# Patient Record
Sex: Male | Born: 1965 | Race: White | Hispanic: No | Marital: Married | Smoking: Current every day smoker
Health system: Southern US, Community
[De-identification: ages and names within clinical notes are randomized; demographics above are authoritative.]

## PROBLEM LIST (undated history)

## (undated) DIAGNOSIS — B1920 Unspecified viral hepatitis C without hepatic coma: Secondary | ICD-10-CM

## (undated) DIAGNOSIS — F17200 Nicotine dependence, unspecified, uncomplicated: Secondary | ICD-10-CM

## (undated) DIAGNOSIS — K859 Acute pancreatitis without necrosis or infection, unspecified: Secondary | ICD-10-CM

## (undated) HISTORY — DX: Unspecified viral hepatitis C without hepatic coma: B19.20

## (undated) HISTORY — DX: Acute pancreatitis without necrosis or infection, unspecified: K85.90

## (undated) HISTORY — DX: Nicotine dependence, unspecified, uncomplicated: F17.200

---

## 2004-12-16 ENCOUNTER — Other Ambulatory Visit: Payer: Self-pay

## 2004-12-16 ENCOUNTER — Emergency Department: Payer: Self-pay | Admitting: Emergency Medicine

## 2005-08-08 ENCOUNTER — Emergency Department: Payer: Self-pay | Admitting: Emergency Medicine

## 2006-10-04 IMAGING — CR DG CHEST 2V
1 series · 2 of 2 positions shown · non-contrast
Comparison: none

REASON FOR EXAM: Rule out infiltrate, chest pain
COMMENTS:

[Series 1373: postero_anterior · 0.11mm/px · 2 of 2 slices shown]
[im 1/2]
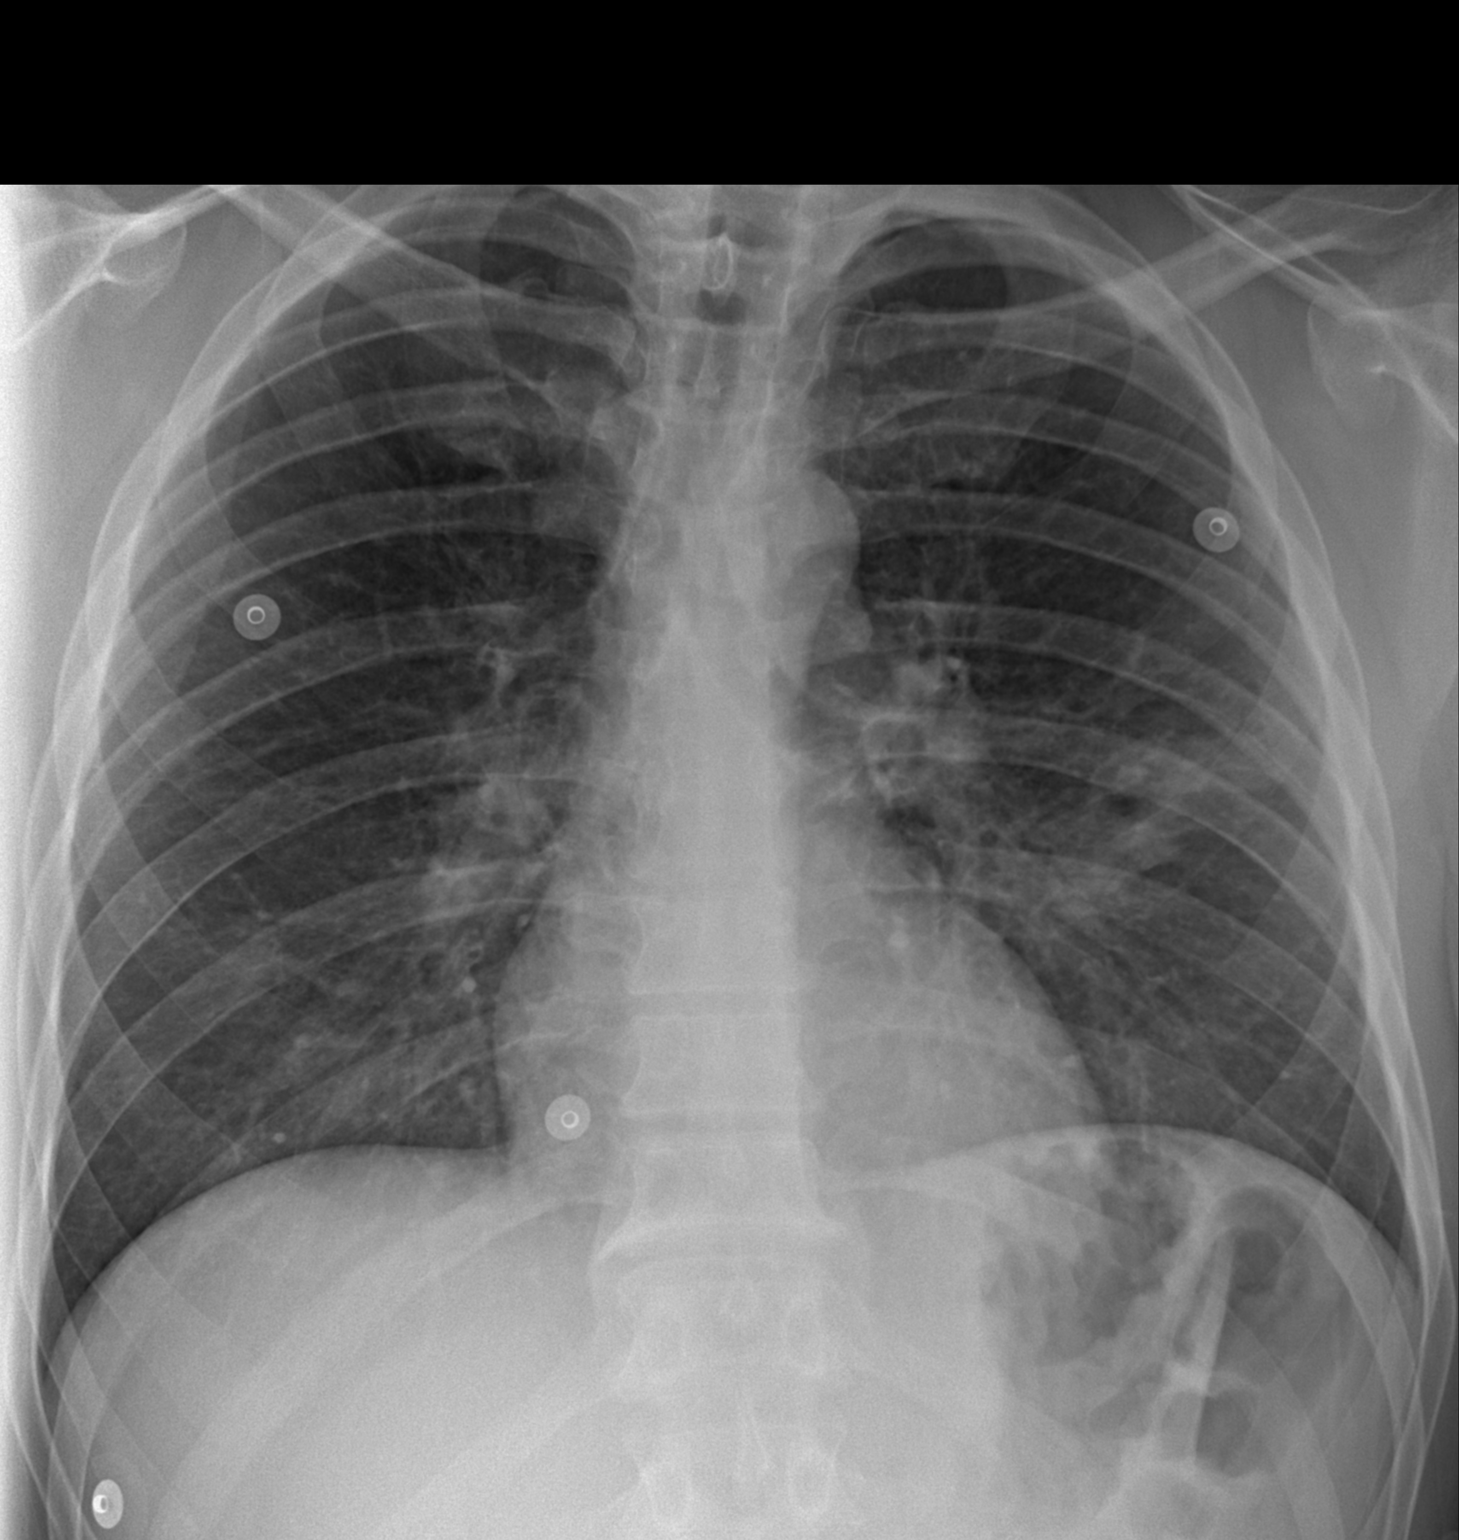
[im 2/2]
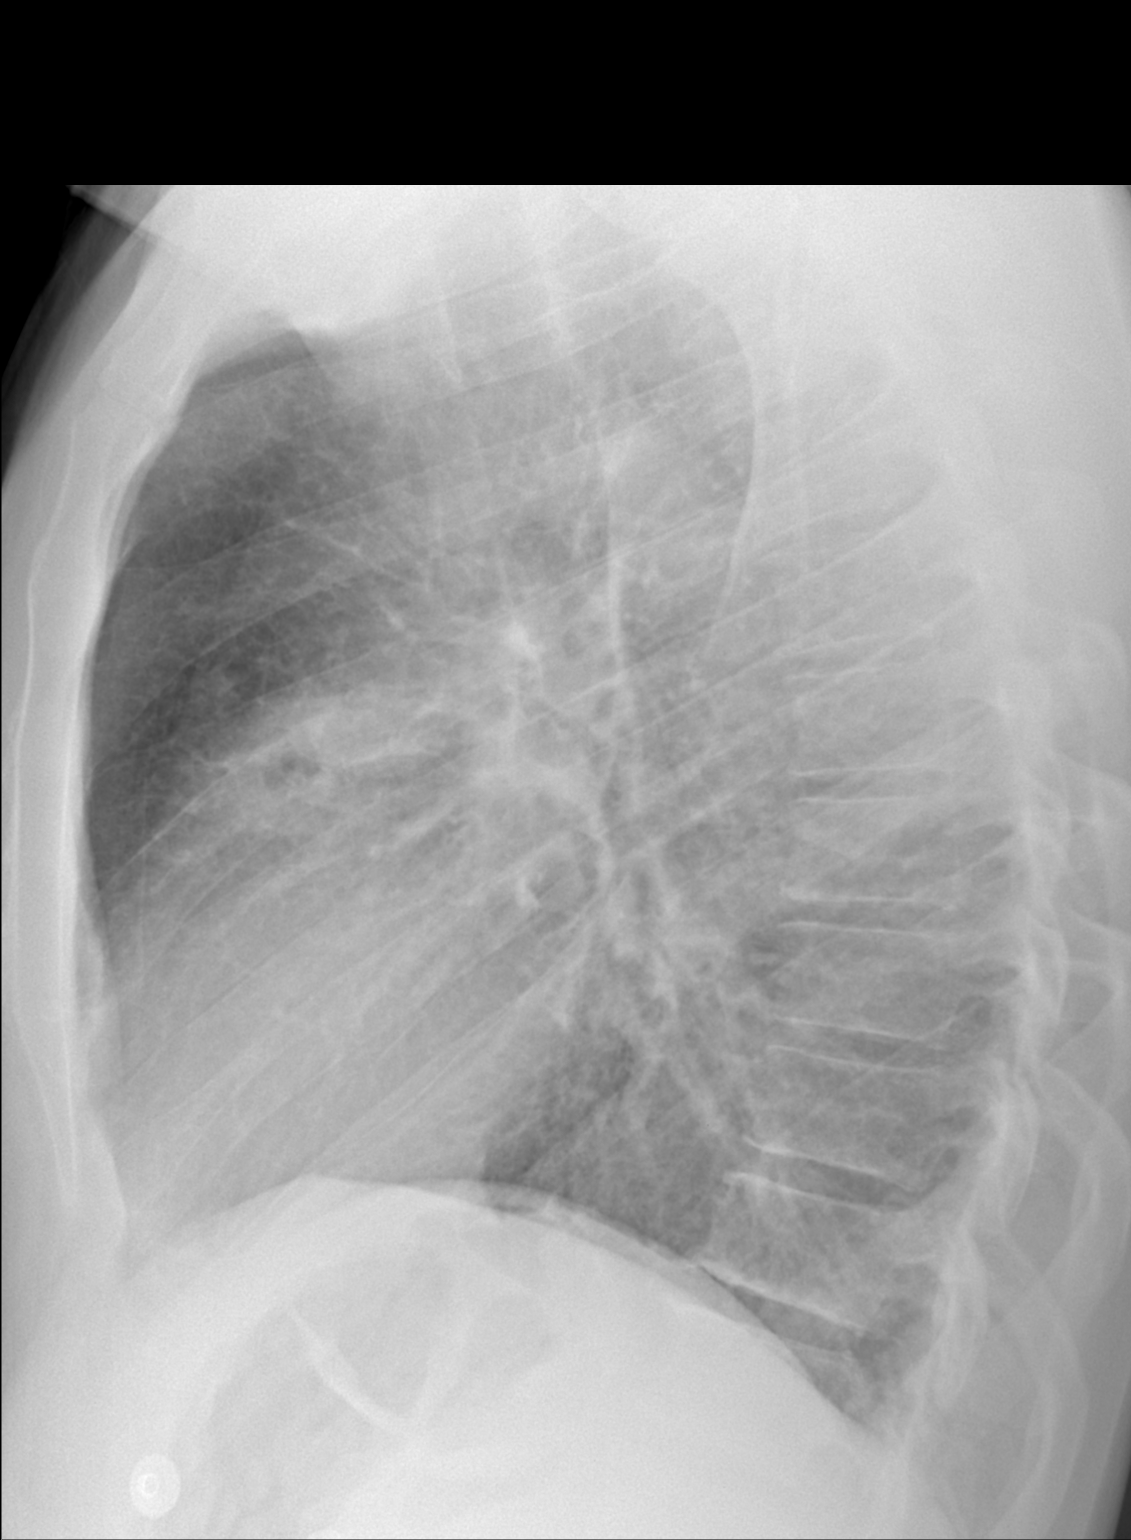

[2 of 2 positions shown; findings below may reference images not displayed]

PROCEDURE:     DXR - DXR CHEST PA (OR AP) AND LATERAL  - December 16, 2004  [DATE]

RESULT:     The lungs are hyperexpanded compatible with a history of COPD or
asthma. There is a patchy infiltrate in the LEFT mid lung field compatible
with pneumonia or atelectasis. The RIGHT lung field is clear. The heart size
is normal. The mediastinal and osseous structures are normal in appearance.
IMPRESSION: 1.     There is patchy infiltrate in the LEFT mid lung field consistent with
pneumonia and atelectasis.
2.     The chest is hyperexpanded compatible with a history of COPD or
asthma.

## 2012-04-20 ENCOUNTER — Emergency Department: Payer: Self-pay | Admitting: Emergency Medicine

## 2012-04-20 LAB — BASIC METABOLIC PANEL
Calcium, Total: 9.2 mg/dL (ref 8.5–10.1)
Chloride: 103 mmol/L (ref 98–107)
Co2: 29 mmol/L (ref 21–32)
Creatinine: 0.8 mg/dL (ref 0.60–1.30)
EGFR (African American): >60
EGFR (Non-African Amer.): >60
Osmolality: 271 (ref 275–301)
Potassium: 4.1 mmol/L (ref 3.5–5.1)

## 2013-10-09 ENCOUNTER — Ambulatory Visit: Payer: Self-pay | Admitting: Physician Assistant

## 2013-10-09 LAB — URINALYSIS, COMPLETE
BLOOD: NEGATIVE
Bacteria: NEGATIVE
GLUCOSE, UR: NEGATIVE mg/dL (ref 0–75)
Leukocyte Esterase: NEGATIVE
NITRITE: NEGATIVE
Ph: 6 (ref 4.5–8.0)
SPECIFIC GRAVITY: 1.02 (ref 1.003–1.030)
SQUAMOUS EPITHELIAL: NONE SEEN

## 2013-10-09 LAB — CBC WITH DIFFERENTIAL/PLATELET
BASOS PCT: 0.9 %
Basophil #: 0.1 10*3/uL (ref 0.0–0.1)
EOS PCT: 0.5 %
Eosinophil #: 0.1 10*3/uL (ref 0.0–0.7)
HCT: 48.2 % (ref 40.0–52.0)
HGB: 16.4 g/dL (ref 13.0–18.0)
LYMPHS PCT: 15.3 %
Lymphocyte #: 2.2 10*3/uL (ref 1.0–3.6)
MCH: 33.8 pg (ref 26.0–34.0)
MCHC: 34.1 g/dL (ref 32.0–36.0)
MCV: 99 fL (ref 80–100)
MONO ABS: 1.4 x10 3/mm — AB (ref 0.2–1.0)
Monocyte %: 9.6 %
NEUTROS ABS: 10.4 10*3/uL — AB (ref 1.4–6.5)
Neutrophil %: 73.7 %
Platelet: 248 10*3/uL (ref 150–440)
RBC: 4.85 10*6/uL (ref 4.40–5.90)
RDW: 13.1 % (ref 11.5–14.5)
WBC: 14.1 10*3/uL — AB (ref 3.8–10.6)

## 2013-10-09 LAB — COMPREHENSIVE METABOLIC PANEL
ALBUMIN: 3.7 g/dL (ref 3.4–5.0)
ALT: 58 U/L (ref 12–78)
AST: 30 U/L (ref 15–37)
Alkaline Phosphatase: 97 U/L
Anion Gap: 12 (ref 7–16)
BUN: 8 mg/dL (ref 7–18)
Bilirubin,Total: 0.6 mg/dL (ref 0.2–1.0)
CHLORIDE: 99 mmol/L (ref 98–107)
CREATININE: 0.8 mg/dL (ref 0.60–1.30)
Calcium, Total: 9.3 mg/dL (ref 8.5–10.1)
Co2: 26 mmol/L (ref 21–32)
EGFR (African American): >60
EGFR (Non-African Amer.): >60
Glucose: 86 mg/dL (ref 65–99)
OSMOLALITY: 271 (ref 275–301)
POTASSIUM: 3.8 mmol/L (ref 3.5–5.1)
Sodium: 137 mmol/L (ref 136–145)
Total Protein: 8.3 g/dL — ABNORMAL HIGH (ref 6.4–8.2)

## 2013-10-09 LAB — LIPASE, BLOOD: Lipase: 196 U/L (ref 73–393)

## 2013-10-09 LAB — AMYLASE: Amylase: 80 U/L (ref 25–115)

## 2015-05-19 IMAGING — CT CT ABD-PELV W/ CM
2 of 5 series · 16 of 46 positions shown, 18 images · IV contrast (isovue)
Comparison: 08/08/2005.

CLINICAL DATA: Epigastric pain with leukocytosis.

EXAM:
CT ABDOMEN AND PELVIS WITH CONTRAST
TECHNIQUE: Multidetector CT imaging of the abdomen and pelvis was performed
using the standard protocol following bolus administration of
intravenous contrast.
CONTRAST:  100 cc Isovue 370.

[Series 2: routine abd pel with · axial · 0.71mm/px · z∈[-527,-137]mm · 13 of 88 slices shown, 15 images]
[im 5/88  soft-tissue]
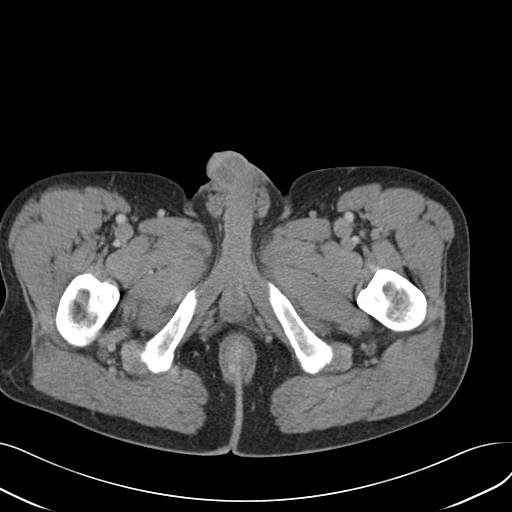
[im 5/88  bone]
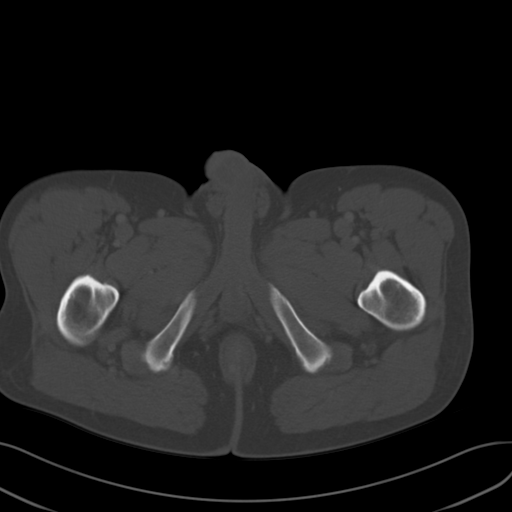
[im 14/88  soft-tissue]
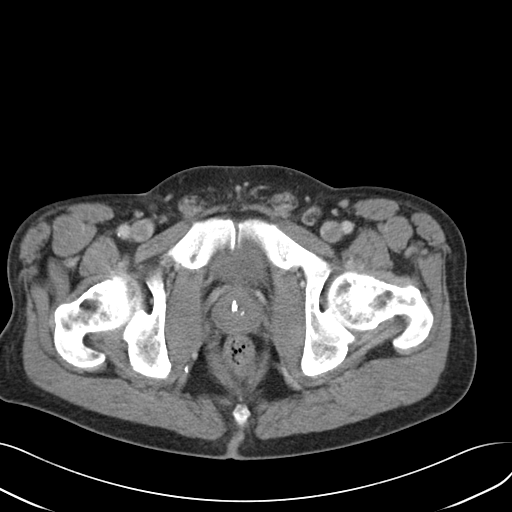
[im 19/88  soft-tissue]
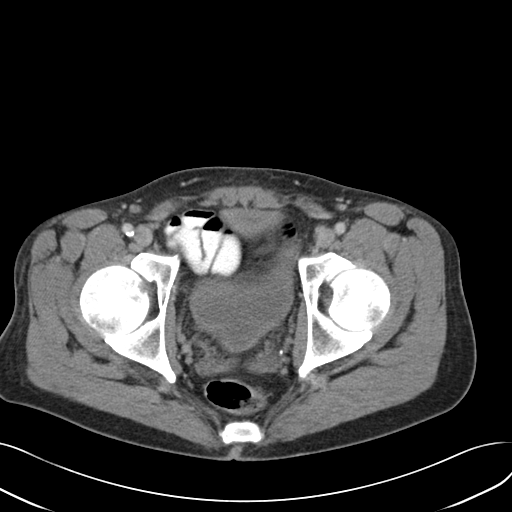
[im 23/88  soft-tissue]
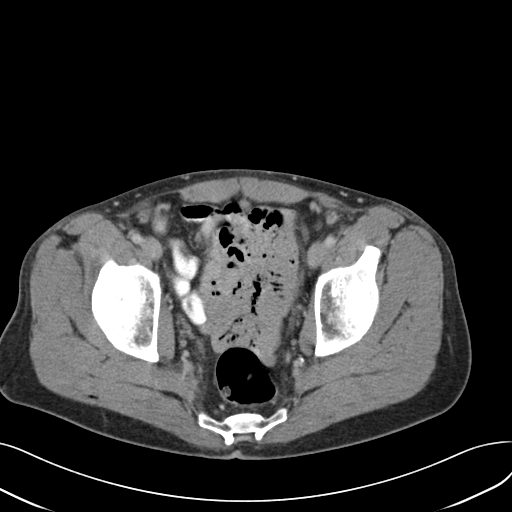
[im 33/88  soft-tissue]
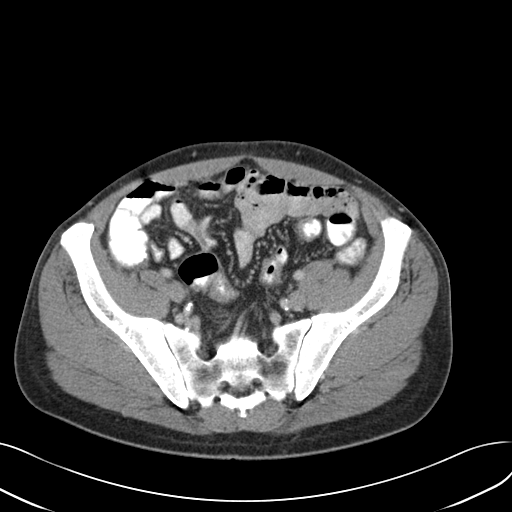
[im 37/88  soft-tissue]
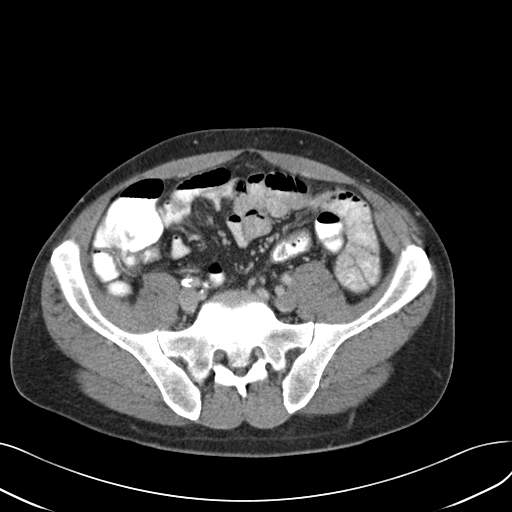
[im 46/88  soft-tissue]
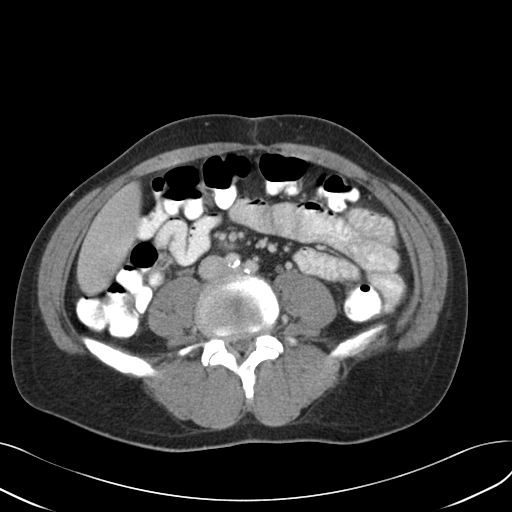
[im 51/88  soft-tissue]
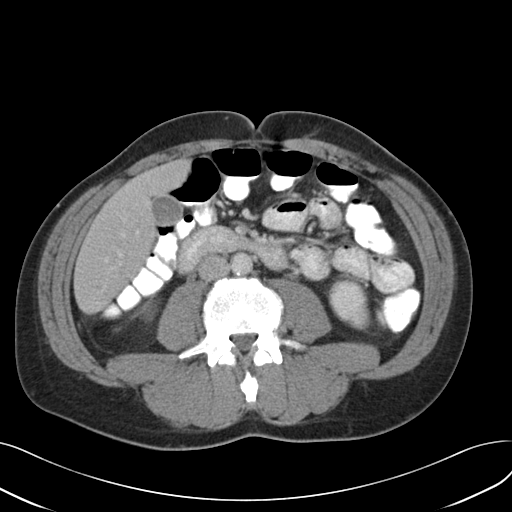
[im 55/88  soft-tissue]
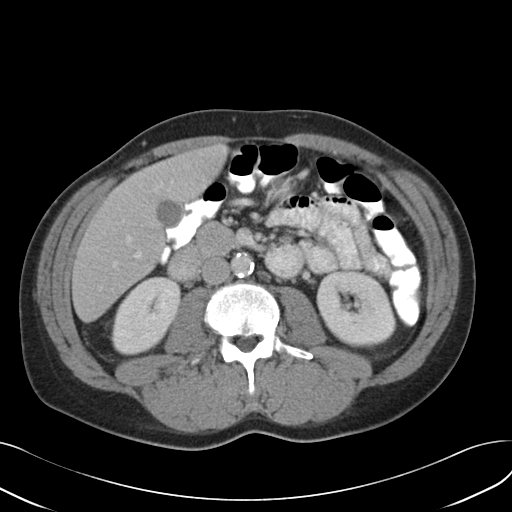
[im 55/88  bone]
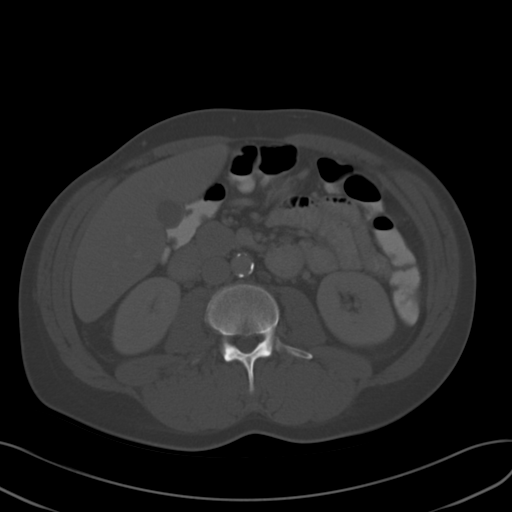
[im 65/88  soft-tissue]
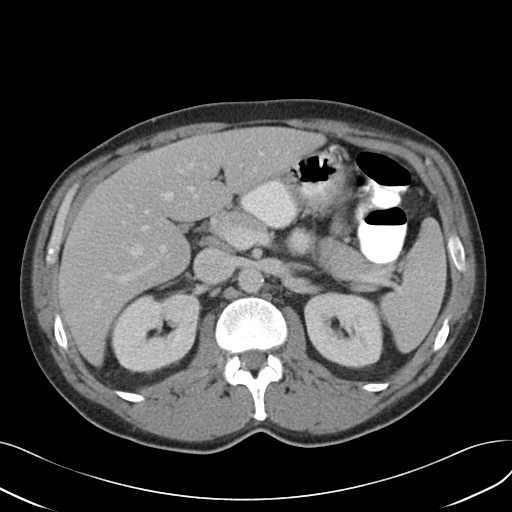
[im 69/88  soft-tissue]
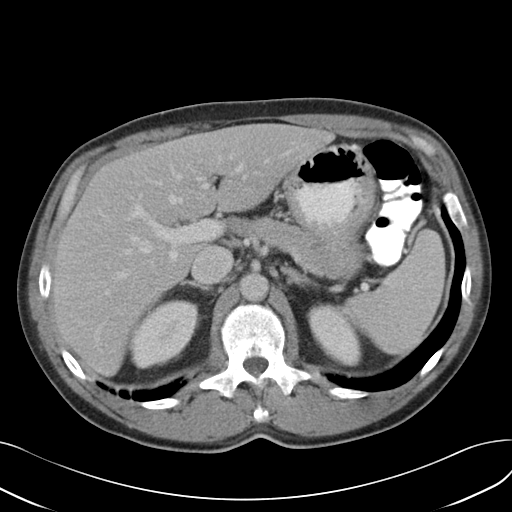
[im 74/88  soft-tissue]
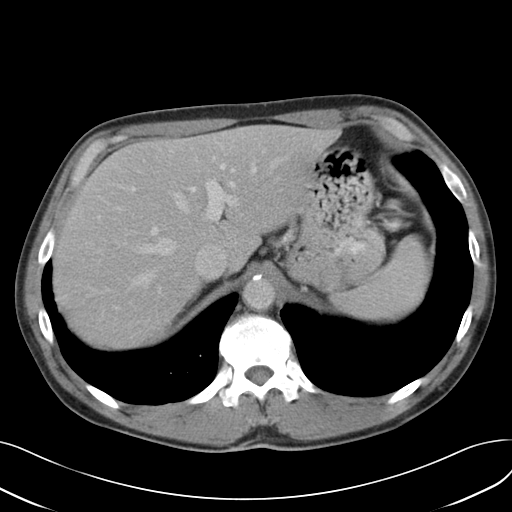
[im 83/88  soft-tissue]
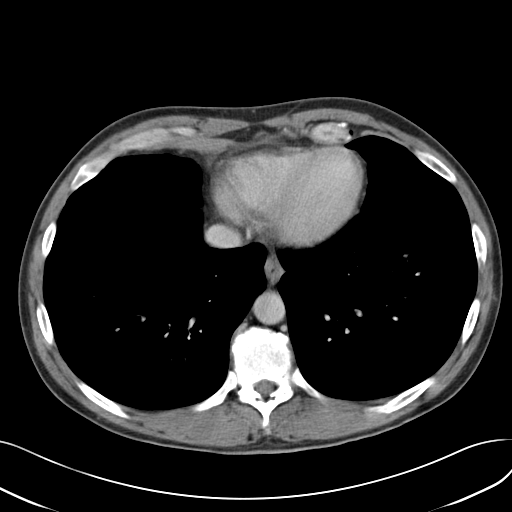

[Series 5: cor routine abd pel with · coronal · 0.65mm/px · 3 of 120 slices shown]
[im 40/120  soft-tissue]
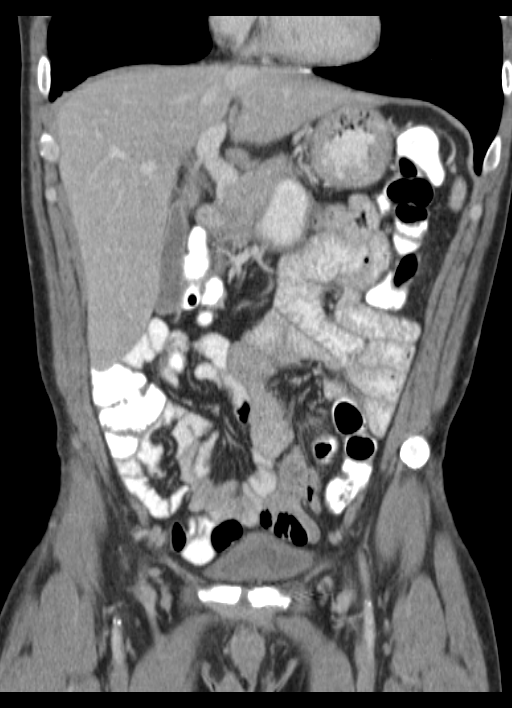
[im 53/120  soft-tissue]
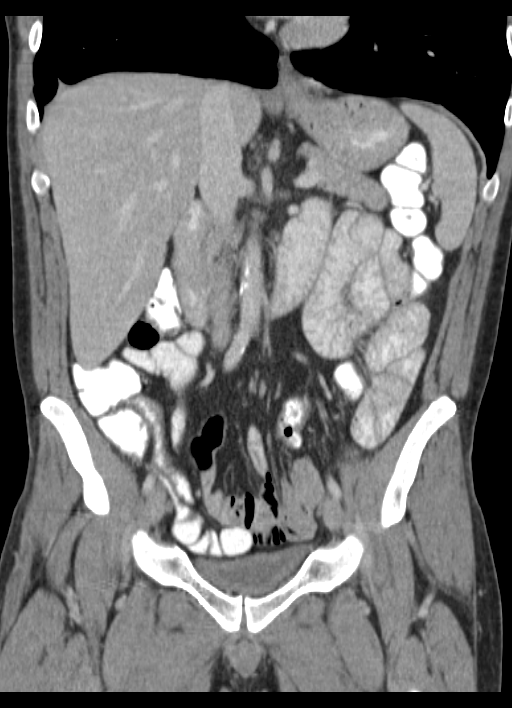
[im 67/120  soft-tissue]
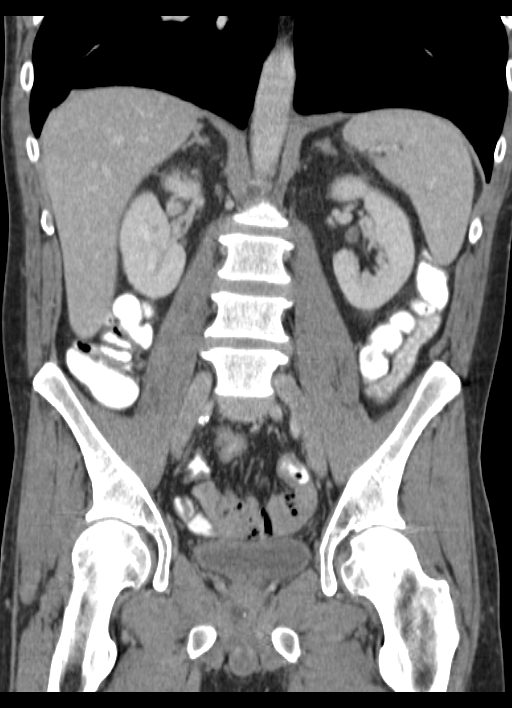

[16 of 46 positions shown; findings below may reference images not displayed]

FINDINGS: Lung Bases: Chronic bronchial wall thickening is evident.

Liver:  Normal.

Spleen: Normal.

Stomach: Normal.

Pancreas: Normal without evidence for mass lesion or main duct
dilatation. No evidence for peripancreatic edema or inflammation.

Gallbladder/Biliary: Gallbladder is unremarkable. No intra or
extrahepatic biliary duct dilatation.

Kidneys/Adrenals: No adrenal nodule or mass. Kidneys are normal
bilaterally.

Bowel Loops: Duodenum is normal. Small bowel loops are normal
without dilatation or wall thickening. Terminal ileum is normal. The
appendix is normal. Oral contrast material has migrated through to
the rectum. Colon has normal imaging features without wall
thickening or dilatation. No evidence for colonic diverticulitis.

Nodes: No lymphadenopathy in the abdomen. There is no pelvic
lymphadenopathy.

Vasculature: Atherosclerotic calcification is noted in the wall of
the abdominal aorta without aneurysm.

Pelvic Genitourinary: Bladder is normal in appearance. Dystrophic
calcification in the central prostate gland is nonspecific.

Bones/Musculoskeletal: Bone windows reveal no worrisome lytic or
sclerotic osseous lesions.

Body Wall: No abdominal wall hernia.

Other: No intraperitoneal free fluid.
IMPRESSION: No acute findings in the abdomen or pelvis. Specifically, no
findings to explain the patient's history of epigastric pain with
leukocytosis.

## 2017-10-28 ENCOUNTER — Encounter: Payer: Self-pay | Admitting: Family Medicine

## 2017-10-28 ENCOUNTER — Ambulatory Visit: Payer: Self-pay | Admitting: Family Medicine

## 2017-10-28 VITALS — BP 170/90 | HR 89 | Temp 98.2°F | Resp 16 | Ht 67.25 in | Wt 175.3 lb

## 2017-10-28 DIAGNOSIS — B182 Chronic viral hepatitis C: Secondary | ICD-10-CM | POA: Insufficient documentation

## 2017-10-28 DIAGNOSIS — I10 Essential (primary) hypertension: Secondary | ICD-10-CM

## 2017-10-28 DIAGNOSIS — Z720 Tobacco use: Secondary | ICD-10-CM

## 2017-10-28 DIAGNOSIS — Z23 Encounter for immunization: Secondary | ICD-10-CM

## 2017-10-28 DIAGNOSIS — Z1211 Encounter for screening for malignant neoplasm of colon: Secondary | ICD-10-CM

## 2017-10-28 DIAGNOSIS — K860 Alcohol-induced chronic pancreatitis: Secondary | ICD-10-CM

## 2017-10-28 DIAGNOSIS — R42 Dizziness and giddiness: Secondary | ICD-10-CM

## 2017-10-28 DIAGNOSIS — K861 Other chronic pancreatitis: Secondary | ICD-10-CM | POA: Insufficient documentation

## 2017-10-28 MED ORDER — ASPIRIN EC 81 MG PO TBEC: 81.0000 mg | DELAYED_RELEASE_TABLET | Freq: Every day | ORAL | 0 refills | Status: AC

## 2017-10-28 MED ORDER — LISINOPRIL-HYDROCHLOROTHIAZIDE 20-12.5 MG PO TABS: 1.0000 | ORAL_TABLET | Freq: Every day | ORAL | 0 refills | Status: DC

## 2017-10-28 NOTE — Progress Notes (Addendum)
Name: Daniel Andrade   MRN: [id number]    DOB: 01-30-1966   Date:10/28/2017       Progress Note  Subjective  Chief Complaint  Chief Complaint  Patient presents with  . Establish Care  . Hypertension    HPI  HTN uncontrolled: he did not have a PCP because of lack of insurance. However went to dentist 3 months ago for cleaning and teeth extraction and procedure was cancelled because of bp. He states after that visit he had one episode of severe dizziness , he was staggering his gait, blurred vision and felt flushed. He drove himself to the local pharmacy and bp was very high ( he said one over 200), he drove himself home and sept for about 2 hours and felt better when he got up. He has never taken bp medication. He denies chest pain or palpitation. He is not sure of high cholesterol. His father had a stroke at 12   Chronic hepatitis C: he was diagnosed about 5 years ago, he states saw GI and was given a rx but could not afford medication because of cost and lost to follow up.   Tobacco use: explained importance of quitting smoking , risk of cancer and COPD but he is not ready  Chronic pancreatitis: he used to be a heavier drinker - liquor - he quit for year, but now is drinking beer again . AUDIT of 9. He states wakes up with upper abdominal pain also nausea and occasional vomiting. His weight has been stable. He does not take pain medication.    Patient Active Problem List   Diagnosis Date Noted  . Hepatitis C, chronic (HCC) 10/28/2017  . Tobacco abuse 10/28/2017  . Uncontrolled hypertension 10/28/2017  . Chronic pancreatitis (HCC) 10/28/2017    History reviewed. No pertinent surgical history.  Family History  Problem Relation Age of Onset  . Hepatitis C Mother        Car Wreck during transfusion  . Stroke Father   . Heart attack Brother   . Dementia Maternal Grandmother     Social History   Socioeconomic History  . Marital status: Married    Spouse name: Not on file  .  Number of children: 1  . Years of education: Not on file  . Highest education level: GED or equivalent  Occupational History  . Not on file  Social Needs  . Financial resource strain: Not hard at all  . Food insecurity:    Worry: Never true    Inability: Never true  . Transportation needs:    Medical: No    Non-medical: No  Tobacco Use  . Smoking status: Current Every Day Smoker    Packs/day: 2.00    Years: 30.00    Pack years: 60.00    Types: Cigarettes    Start date: 10/29/1987  . Smokeless tobacco: Never Used  Substance and Sexual Activity  . Alcohol use: Yes    Alcohol/week: 4.8 oz    Types: 8 Cans of beer per week  . Drug use: Yes    Types: Marijuana  . Sexual activity: Yes    Partners: Female  Lifestyle  . Physical activity:    Days per week: 0 days    Minutes per session: 0 min  . Stress: Not at all  Relationships  . Social connections:    Talks on phone: More than three times a week    Gets together: More than three times a week  Attends religious service: More than 4 times per year    Active member of club or organization: No    Attends meetings of clubs or organizations: Never    Relationship status: Married  . Intimate partner violence:    Fear of current or ex partner: No    Emotionally abused: No    Physically abused: No    Forced sexual activity: No  Other Topics Concern  . Not on file  Social History Narrative   He is on his second marriage. 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  aspirin EC 81 MG tablet, Take 1 tablet (81 mg total) by mouth daily., Disp: 30 tablet, Rfl: 0 .  lisinopril-hydrochlorothiazide (ZESTORETIC) 20-12.5 MG tablet, Take 1-2 tablets by mouth daily. Start one a day and after 3 days take two  daily, Disp: 60 tablet, Rfl: 0  No Known Allergies   ROS  Constitutional: Negative for fever or weight change.  Respiratory: positive for cough but no  shortness of breath.   Cardiovascular: Negative for chest pain or palpitations.  Gastrointestinal: positive  for abdominal pain usually in am's, no bowel changes.  Musculoskeletal: Negative for gait problem or joint swelling.  Skin: Negative for rash.  Neurological: Negative for dizziness or headache.  No other specific complaints in a complete review of systems (except as listed in HPI above).  Objective  Vitals:   10/28/17 0911  BP: (!) 170/90  Pulse: 89  Resp: 16  Temp: 98.2 F (36.8 C)  TempSrc: Oral  SpO2: 96%  Weight: 175 lb 4.8 oz (79.5 kg)  Height: 5' 7.25" (1.708 m)    Body mass index is 27.25 kg/m.  Physical Exam  Constitutional: Patient appears well-developed and well-nourished. Overweight No distress.  HEENT: head atraumatic, normocephalic, pupils equal and reactive to light,neck supple, throat within normal limits Cardiovascular: Normal rate, regular rhythm and normal heart sounds.  No murmur heard. No BLE edema. Pulmonary/Chest: Effort normal and breath sounds normal. No respiratory distress. Abdominal: Soft.  There is no tenderness. Psychiatric: Patient has a normal mood and affect. behavior is normal. Judgment and thought content normal.  PHQ2/9: Depression screen PHQ 2/9 10/28/2017  Decreased Interest 0  Down, Depressed, Hopeless 0  PHQ - 2 Score 0     Fall Risk: Fall Risk  10/28/2017  Falls in the past year? No     Functional Status Survey: Is the patient deaf or have difficulty hearing?: No Does the patient have difficulty seeing, even when wearing glasses/contacts?: Yes(glasses) Does the patient have difficulty concentrating, remembering, or making decisions?: No Does the patient have difficulty walking or climbing stairs?: No Does the patient have difficulty dressing or bathing?:  No Does the patient have difficulty doing errands alone such as visiting a doctor's office or shopping?: No    Assessment & Plan  1. Uncontrolled hypertension  - lisinopril-hydrochlorothiazide (ZESTORETIC) 20-12.5 MG tablet; Take 1-2 tablets by mouth daily. Start one a day and after 3 days take two daily  Dispense: 60 tablet; Refill: 0 - CBC with Differential/Platelet - COMPLETE METABOLIC PANEL WITH GFR - Lipid panel - Hemoglobin A1c - Urine Microalbumin w/creat. ratio - aspirin EC 81 MG tablet; Take 1 tablet (81  mg total) by mouth daily.  Dispense: 30 tablet; Refill: 0 - EKG 12-Lead : discussed with patient, he cannot afford referral to cardiologist. We will check labs   2. Tobacco abuse  Discussed importance of quitting smoking   3. Chronic hepatitis C without hepatic coma (HCC)  He was seen by GI in the past but could not afford medication   4. Episode of dizziness  He may have had a mini stroke , it happened months ago, discussed importance of calling 911 if it happens again, control bp and start aspirin 81 mg  5. Need for Tdap vaccination  Not given because of cost  6. Need for shingles vaccine  Not given because of cost  7. Need for pneumococcal vaccine  Not given because of cost  8. Alcohol-induced chronic pancreatitis (HCC)  Advised to quit drinking   9. Colon cancer screening  - POC Hemoccult Bld/Stl (3-Cd Home Screen); Future

## 2017-10-28 NOTE — Patient Instructions (Signed)

## 2017-10-29 LAB — LIPID PANEL
Cholesterol: 167 mg/dL (ref <200)
HDL: 68 mg/dL (ref >40)
LDL CHOLESTEROL (CALC): 90 mg/dL
Non-HDL Cholesterol (Calc): 99 mg/dL (calc) (ref <130)
Total CHOL/HDL Ratio: 2.5 (calc) (ref <5.0)
Triglycerides: 32 mg/dL (ref <150)

## 2017-10-29 LAB — CBC WITH DIFFERENTIAL/PLATELET
Basophils Absolute: 50 cells/uL (ref 0–200)
Basophils Relative: 0.6 %
EOS ABS: 100 {cells}/uL (ref 15–500)
Eosinophils Relative: 1.2 %
HCT: 47.5 % (ref 38.5–50.0)
Hemoglobin: 17.2 g/dL — ABNORMAL HIGH (ref 13.2–17.1)
Lymphs Abs: 1901 cells/uL (ref 850–3900)
MCH: 33.4 pg — AB (ref 27.0–33.0)
MCHC: 36.2 g/dL — ABNORMAL HIGH (ref 32.0–36.0)
MCV: 92.2 fL (ref 80.0–100.0)
MPV: 9.7 fL (ref 7.5–12.5)
Monocytes Relative: 9.3 %
NEUTROS ABS: 5478 {cells}/uL (ref 1500–7800)
Neutrophils Relative %: 66 %
Platelets: 190 10*3/uL (ref 140–400)
RBC: 5.15 10*6/uL (ref 4.20–5.80)
RDW: 12.2 % (ref 11.0–15.0)
Total Lymphocyte: 22.9 %
WBC mixed population: 772 cells/uL (ref 200–950)
WBC: 8.3 10*3/uL (ref 3.8–10.8)

## 2017-10-29 LAB — HEMOGLOBIN A1C
HEMOGLOBIN A1C: 5.2 %{Hb} (ref <5.7)
MEAN PLASMA GLUCOSE: 103 (calc)
eAG (mmol/L): 5.7 (calc)

## 2017-10-29 LAB — COMPLETE METABOLIC PANEL WITH GFR
AG Ratio: 1.9 (calc) (ref 1.0–2.5)
ALT: 136 U/L — ABNORMAL HIGH (ref 9–46)
AST: 115 U/L — ABNORMAL HIGH (ref 10–35)
Albumin: 4.9 g/dL (ref 3.6–5.1)
Alkaline phosphatase (APISO): 101 U/L (ref 40–115)
BUN: 9 mg/dL (ref 7–25)
CHLORIDE: 95 mmol/L — AB (ref 98–110)
CO2: 28 mmol/L (ref 20–32)
Calcium: 9.8 mg/dL (ref 8.6–10.3)
Creat: 0.73 mg/dL (ref 0.70–1.33)
GFR, Est African American: 125 mL/min/{1.73_m2} (ref >=60)
GFR, Est Non African American: 108 mL/min/{1.73_m2} (ref >=60)
GLUCOSE: 87 mg/dL (ref 65–99)
Globulin: 2.6 g/dL (calc) (ref 1.9–3.7)
Potassium: 4.6 mmol/L (ref 3.5–5.3)
Sodium: 131 mmol/L — ABNORMAL LOW (ref 135–146)
Total Bilirubin: 0.7 mg/dL (ref 0.2–1.2)
Total Protein: 7.5 g/dL (ref 6.1–8.1)

## 2017-10-29 LAB — MICROALBUMIN / CREATININE URINE RATIO
Creatinine, Urine: 31 mg/dL (ref 20–320)
Microalb Creat Ratio: 16 mcg/mg creat (ref <30)
Microalb, Ur: 0.5 mg/dL

## 2017-11-02 ENCOUNTER — Other Ambulatory Visit: Payer: Self-pay | Admitting: Family Medicine

## 2017-11-02 ENCOUNTER — Telehealth: Payer: Self-pay | Admitting: Family Medicine

## 2017-11-02 DIAGNOSIS — E871 Hypo-osmolality and hyponatremia: Secondary | ICD-10-CM

## 2017-11-02 NOTE — Telephone Encounter (Signed)
That does not seem related to bp medication , unless if he has a history of allergic reaction to sulfa. Otherwise try to take it again tonight to make sure it was the medication that caused side effect

## 2017-11-02 NOTE — Telephone Encounter (Signed)
I ordered labs and he can stop by any day this week to have it repeated.

## 2017-11-02 NOTE — Telephone Encounter (Signed)
Patient called for lab results- in conversation he wanted provider to know:  He had to stop BP medication- it gave him severe headache, stomach pain and diarrhea. The headache was the worst- it put him in the bed all weekend. He wants to know if there is an alternative [facility]. May leave message on VM on cell #.  * As far as GI referral- [facility] is going [facility] and [facility] if [facility] qual[facility].He will let PCP know as soon as he finds out. * When does PCP want to repeat his lab levels- let him know and he will schedule to repeat them.

## 2017-11-02 NOTE — Telephone Encounter (Signed)
Left a message to notify the patient to try his BP medication again unless he has a reaction to Sulfa.  She was notified about the GI referral- he is going to [facility] and see if he qual[facility].He will let PCP know as soon as he finds out.  * When does PCP want to repeat his lab levels- let him know and he will schedule to repeat them.

## 2017-11-03 ENCOUNTER — Telehealth: Payer: Self-pay | Admitting: Family Medicine

## 2017-11-03 ENCOUNTER — Other Ambulatory Visit: Payer: Self-pay

## 2017-11-03 ENCOUNTER — Other Ambulatory Visit: Payer: Self-pay | Admitting: Family Medicine

## 2017-11-03 DIAGNOSIS — E871 Hypo-osmolality and hyponatremia: Secondary | ICD-10-CM

## 2017-11-03 MED ORDER — AMLODIPINE BESYLATE 2.5 MG PO TABS: 2.5000 mg | ORAL_TABLET | Freq: Every day | ORAL | 0 refills | Status: DC

## 2017-11-03 NOTE — Telephone Encounter (Signed)
Changed to norvasc , he needs to return for bp check

## 2017-11-03 NOTE — Telephone Encounter (Signed)
Please advise or send in new rx as requested

## 2017-11-03 NOTE — Telephone Encounter (Signed)
Pt returned call and message given to him regarding labs to be done. Pt voiced understanding. Pt also is concerned about taking the b/p medication (lisinopril-hydrochlorothiazide). He states that he is having headaches and stomach problems. He can not work like that. He is requesting something different to be called in for his b/p. He is requesting a call back.

## 2017-11-03 NOTE — Telephone Encounter (Signed)
Left message that patient's labs are upfront and he can come by anytime this week to have them done.

## 2017-11-03 NOTE — Telephone Encounter (Signed)
Rhonda please notify patient of the RX change and have him come in for a BP check  Thanks

## 2017-11-03 NOTE — Telephone Encounter (Signed)
Lft message that dr has changed rx and that she wants him to come in for a nurse visit for a blood pressure check.

## 2022-06-05 ENCOUNTER — Emergency Department: Payer: BC Managed Care – PPO

## 2022-06-05 ENCOUNTER — Other Ambulatory Visit: Payer: Self-pay

## 2022-06-05 ENCOUNTER — Emergency Department
Admission: EM | Admit: 2022-06-05 | Discharge: 2022-06-05 | Payer: BC Managed Care – PPO | Attending: Emergency Medicine | Admitting: Emergency Medicine

## 2022-06-05 DIAGNOSIS — J441 Chronic obstructive pulmonary disease with (acute) exacerbation: Secondary | ICD-10-CM | POA: Diagnosis not present

## 2022-06-05 DIAGNOSIS — R079 Chest pain, unspecified: Secondary | ICD-10-CM | POA: Diagnosis present

## 2022-06-05 DIAGNOSIS — E871 Hypo-osmolality and hyponatremia: Secondary | ICD-10-CM | POA: Diagnosis not present

## 2022-06-05 DIAGNOSIS — R911 Solitary pulmonary nodule: Secondary | ICD-10-CM | POA: Diagnosis not present

## 2022-06-05 DIAGNOSIS — I10 Essential (primary) hypertension: Secondary | ICD-10-CM | POA: Insufficient documentation

## 2022-06-05 LAB — CBC
HCT: 43 % (ref 39.0–52.0)
Hemoglobin: 15.5 g/dL (ref 13.0–17.0)
MCH: 33.7 pg (ref 26.0–34.0)
MCHC: 36 g/dL (ref 30.0–36.0)
MCV: 93.5 fL (ref 80.0–100.0)
Platelets: 174 10*3/uL (ref 150–400)
RBC: 4.6 MIL/uL (ref 4.22–5.81)
RDW: 12.4 % (ref 11.5–15.5)
WBC: 10.4 10*3/uL (ref 4.0–10.5)
nRBC: 0 % (ref 0.0–0.2)

## 2022-06-05 LAB — BASIC METABOLIC PANEL
Anion gap: 11 (ref 5–15)
BUN: <5 mg/dL — ABNORMAL LOW (ref 6–20)
CO2: 22 mmol/L (ref 22–32)
Calcium: 9.2 mg/dL (ref 8.9–10.3)
Chloride: 95 mmol/L — ABNORMAL LOW (ref 98–111)
Creatinine, Ser: 0.53 mg/dL — ABNORMAL LOW (ref 0.61–1.24)
GFR, Estimated: >60 mL/min (ref >60)
Glucose, Bld: 85 mg/dL (ref 70–99)
Potassium: 4.1 mmol/L (ref 3.5–5.1)
Sodium: 128 mmol/L — ABNORMAL LOW (ref 135–145)

## 2022-06-05 LAB — TROPONIN I (HIGH SENSITIVITY)
Troponin I (High Sensitivity): 3 ng/L (ref <18)
Troponin I (High Sensitivity): 3 ng/L (ref <18)

## 2022-06-05 MED ORDER — AMLODIPINE BESYLATE 5 MG PO TABS
2.5000 mg | ORAL_TABLET | Freq: Once | ORAL | Status: AC
  Administered 2022-06-05: 2.5 mg via ORAL
  Filled 2022-06-05: qty 1

## 2022-06-05 MED ORDER — ASPIRIN 81 MG PO CHEW
324.0000 mg | CHEWABLE_TABLET | Freq: Once | ORAL | Status: AC
  Administered 2022-06-05: 324 mg via ORAL
  Filled 2022-06-05: qty 4

## 2022-06-05 MED ORDER — PREDNISONE 20 MG PO TABS: 40.0000 mg | ORAL_TABLET | Freq: Every day | ORAL | 0 refills | Status: DC

## 2022-06-05 MED ORDER — IPRATROPIUM-ALBUTEROL 0.5-2.5 (3) MG/3ML IN SOLN
3.0000 mL | Freq: Once | RESPIRATORY_TRACT | Status: AC
  Administered 2022-06-05: 3 mL via RESPIRATORY_TRACT
  Filled 2022-06-05: qty 3

## 2022-06-05 MED ORDER — PREDNISONE 20 MG PO TABS
60.0000 mg | ORAL_TABLET | Freq: Once | ORAL | Status: AC
  Administered 2022-06-05: 60 mg via ORAL
  Filled 2022-06-05: qty 3

## 2022-06-05 MED ORDER — AZITHROMYCIN 250 MG PO TABS: ORAL_TABLET | ORAL | 0 refills | Status: DC

## 2022-06-05 MED ORDER — AMLODIPINE BESYLATE 2.5 MG PO TABS: 2.5000 mg | ORAL_TABLET | Freq: Every day | ORAL | 2 refills | Status: DC

## 2022-06-05 MED ORDER — IOHEXOL 350 MG/ML SOLN
75.0000 mL | Freq: Once | INTRAVENOUS | Status: AC | PRN
  Administered 2022-06-05: 75 mL via INTRAVENOUS

## 2022-06-05 MED ORDER — ALBUTEROL SULFATE HFA 108 (90 BASE) MCG/ACT IN AERS: INHALATION_SPRAY | RESPIRATORY_TRACT | 1 refills | Status: DC

## 2022-06-05 NOTE — ED Provider Notes (Signed)
[facility] Provider Note    Event Date/Time   First MD Initiated Contact with Patient 06/05/22 [id number]     (approximate)   History   Chest Pain   HPI  Daniel Andrade is a 57 y.o. male reviewed primary care note from September of this year patient has a history of hepatitis C, alcohol abuse, chronic pancreatitis, and notation of a previous myocardial infarction (also reviewed cardiology note from October 2021, notation of hypertension, smoking, coronary risk factors but no notation of a known history of MI).  Patient reports that several years ago he was told he had a "mini heart attack"  This morning at 5:30 AM patient reports he woke up from a dead sleep with a sharp pain in the middle of his left chest.  It was accompanied by feeling of some shortness of breath which has improved.  No fevers no chills no recent illness.  He has been working as a Games developer and has been in normal health up until the sudden pain this morning  No abdominal pain no vomiting.  No fevers or chills.  The pain does not radiate is located below his left chest points towards his left nipple area and reports it feels like a stabbing discomfort.  Does not radiate     Physical Exam   Triage Vital Signs: ED Triage Vitals [06/05/22 0819]  Enc Vitals Group     BP (!) 132/100     Pulse Rate (!) 119     Resp 20     Temp 98 F (36.7 C)     Temp Source Oral     SpO2 97 %     Weight 175 lb 4.3 oz (79.5 kg)     Height '5\' 8"'$  (1.727 m)     Head Circumference      Peak Flow      Pain Score 6     Pain Loc      Pain Edu?      Excl. in [place]?     Most recent vital signs: Vitals:   06/05/22 1200 06/05/22 1230  BP: 128/81 (!) 148/82  Pulse: 99 (!) 108  Resp: 19 17  Temp:  98.1 F (36.7 C)  SpO2:  92%     General: Awake, no distress.  Very pleasant. CV:  Good peripheral perfusion.  Normal tones and rate heart rate approximately 95-100 at this time. Resp:  Normal effort.  Clear in  most fields but has some small amount of what appeared to be dry Crackles noted in the lower bases bilaterally.  He does not have any cough no wheezing.  His work of breathing seems fairly normal although he has slight pursing of his lips.  Patient currently reports his breathing at this time feels normal for him Abd:  No distention.  Soft nontender nondistended Other:     ED Results / Procedures / Treatments   Labs (all labs ordered are listed, but only abnormal results are displayed) Labs Reviewed  BASIC METABOLIC PANEL - Abnormal; Notable for the following components:      Result Value   Sodium 128 (*)    Chloride 95 (*)    BUN <5 (*)    Creatinine, Ser 0.53 (*)    All other components within normal limits  CBC  TROPONIN I (HIGH SENSITIVITY)  TROPONIN I (HIGH SENSITIVITY)     EKG  Is interpreted by me at 8:20 AM heart rate 110 QRS 80 QTc 440  Sinus tachycardia, Q waves noted in septal V2.  No STEMI, no evidence of ACS.  Compared with morphology from previous EKG from October 28, 2017, no significant morphologic changes are noted, however rate increased on today's   RADIOLOGY  CT Angio Chest PE W and/or Wo Contrast  Result Date: 06/05/2022 CLINICAL DATA:  Pleuritic chest pain with tachycardia. Pulmonary embolism suspected, high probability. EXAM: CT ANGIOGRAPHY CHEST WITH CONTRAST TECHNIQUE: Multidetector CT imaging of the chest was performed using the standard protocol during bolus administration of intravenous contrast. Multiplanar CT image reconstructions and MIPs were obtained to evaluate the vascular anatomy. RADIATION DOSE REDUCTION: This exam was performed according to the departmental dose-optimization program which includes automated exposure control, adjustment of the mA and/or kV according to patient size and/or use of iterative reconstruction technique. CONTRAST:  34m OMNIPAQUE IOHEXOL 350 MG/ML SOLN COMPARISON:  Chest CT 12/16/2004. FINDINGS: Cardiovascular: The pulmonary  arteries are well opacified with contrast to the level of the subsegmental branches. There is no evidence of acute pulmonary embolism. Atherosclerosis of the aorta, great vessels and coronary arteries without evidence of acute systemic arterial abnormality. The heart size is normal. There is no pericardial effusion. Mediastinum/Nodes: There are no enlarged mediastinal, hilar or axillary lymph nodes. The thyroid gland, trachea and esophagus demonstrate no significant findings. Lungs/Pleura: No pleural effusion or pneumothorax. Moderate to severe centrilobular and paraseptal emphysema. There is probable mild scarring at the site of the previously demonstrated cavitary process within the lingula. This is associated with a small nodular component measuring 5 mm on image 74/5. There is an additional irregular density posteriorly in the right lower lobe near the other focus of cavitation seen previously, measuring 1.3 x 1.4 cm on image 99/5. This density is part solid and may reflect postinflammatory scarring as well. Diffuse centrilobular ground-glass opacities seen previously have improved. No consolidation. Upper abdomen: No acute or significant findings are seen in the visualized upper abdomen. There is aortic atherosclerosis. Musculoskeletal/Chest wall: There is no chest wall mass or suspicious osseous finding. 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 Review of the MIP images confirms the above findings. IMPRESSION: 1. No evidence of acute pulmonary embolism or other acute chest findings. 2. Probable scarring at the sites of the previously demonstrated cavitary lesions in the lingula and right lower lobe. Given the part solid appearance of the right lower lobe lesion, follow-up non-contrast CT recommended at 3-6 months to confirm persistence. If unchanged, and solid component remains <6 mm, annual CT  is recommended until 5 years of stability has been established. If persistent these nodules should be considered highly suspicious if the solid component of the nodule is 6 mm or greater in size and enlarging. This recommendation follows the consensus statement: Guidelines for Management of Incidental Pulmonary Nodules Detected on CT Images:From the Fleischner Society 2017; published online before print (10.1148/radiol.[id number]. 3. Aortic Atherosclerosis (ICD10-I70.0) and Emphysema (ICD10-J43.9). Electronically Signed   By: WRichardean SaleM.D.   On: 06/05/2022 10:22      PROCEDURES:  Critical Care performed: No  Procedures   MEDICATIONS ORDERED IN ED: Medications  aspirin chewable tablet 324 mg (324 mg Oral Given 06/05/22 0850)  ipratropium-albuterol (DUONEB) 0.5-2.5 (3) MG/3ML nebulizer solution 3 mL (3 mLs Nebulization Given 06/05/22 0850)  amLODipine (NORVASC) tablet 2.5 mg (2.5 mg Oral Given 06/05/22 0850)  ipratropium-albuterol (DUONEB) 0.5-2.5 (3) MG/3ML nebulizer solution 3 mL (3 mLs Nebulization Given 06/05/22 1041)  iohexol (OMNIPAQUE) 350 MG/ML injection 75 mL (75 mLs Intravenous Contrast Given 06/05/22 0958)  predniSONE (DELTASONE) tablet 60 mg (60 mg Oral Given 06/05/22 1302)  ipratropium-albuterol (DUONEB) 0.5-2.5 (3) MG/3ML nebulizer solution 3 mL (3 mLs Nebulization Given 06/05/22 1303)     IMPRESSION / MDM / ASSESSMENT AND PLAN / ED COURSE  I reviewed the triage vital signs and the nursing notes.                              Differential diagnosis includes, but is not limited to, ACS, aortic dissection, pulmonary embolism, cardiac tamponade, pneumothorax, pneumonia, pericarditis, myocarditis, GI-related causes including esophagitis/gastritis, and musculoskeletal chest wall pain.    Given the patient's reassuring initial EKG this lowers my concern for ACS but we certainly will utilize troponins to further rule out ACS.  He also has slight pursed lip breathing and I suspect he  likely has underlying emphysema/COPD though he does not carry the diagnosis.  Will trial a nebulizer treatment to see if this might improve or relieve some of his chest discomfort.  His work of breathing and oxygen saturation are normal, though he has slight pursed lips and he has slight barrel chested appearance.  Also will order CT to exclude pulmonary embolism given the tachycardia and associated pleuritic pain with an elevated concern to rule out PE.  Less likely dissection no radiating pain to the back.  Patient also hypertensive but reports that he quit taking his blood pressure medicine which she thinks was propranolol about 2 to 3 months ago because it was causing him to be very tired and hard for him to work physically while on it.  Will trial Norvasc here  Patient's presentation is most consistent with acute complicated illness / injury requiring diagnostic workup.   The patient is on the cardiac monitor to evaluate for evidence of arrhythmia and/or significant heart rate changes.  Labs interpreted as normal CBC.  Troponin normal x 2.  Mild hyponatremia, asymptomatic    Norvasc given with improved blood pressure.  Patient reports he is since discontinued his blood pressure medication for about the last 2 to 3 months, reports he was at one most recently on propranolol.  ----------------------------------------- 1:25 PM on 06/05/2022 -----------------------------------------  Patient ambulating well.  No ongoing chest pain or dyspnea.  Appears improved.  Oxygen saturation about 91 to 92% with ambulation but patient reports feeling well his breathing is bit his norm for him.  I suspect he has underlying COPD and suspect he may be experiencing a mild COPD exacerbation as a cause of his chest tightness today, but he is markedly improved resting comfortably in no distress.  Prescriptions provided for antihypertensive, prednisone, inhaler, and antibiotic  Return precautions and treatment  recommendations and follow-up discussed with the patient who is agreeable with the plan.  Determined low risk for ACS with reassuring EKG as well as normal troponins.  Atypical discomfort   Return precautions and treatment recommendations and follow-up discussed with the patient who is agreeable with the plan.  Follow-up referral placed for nodule clinic as well and pulmonary  FINAL CLINICAL IMPRESSION(S) / ED DIAGNOSES   Final diagnoses:  Lung nodule  Chronic obstructive pulmonary disease with acute exacerbation ([place])  Uncontrolled hypertension     Rx / DC Orders   ED Discharge Orders          Ordered    AMB  Referral to Pulmonary Nodule Clinic  Comments: See CT imaging from ER today. Follow-up   06/05/22 1149    Ambulatory referral to Pulmonology       Comments: Suspect new COPD   06/05/22 1323    predniSONE (DELTASONE) 20 MG tablet  Daily with breakfast        06/05/22 1324    albuterol (VENTOLIN HFA) 108 (90 Base) MCG/ACT inhaler        06/05/22 1324    azithromycin (ZITHROMAX) 250 MG tablet        06/05/22 1324    amLODipine (NORVASC) 2.5 MG tablet  Daily        06/05/22 1325             Note:  This document was prepared using Dragon voice recognition software and may include unintentional dictation errors.   Delman Kitten, MD 06/05/22 1328

## 2022-06-05 NOTE — ED Notes (Signed)
Pt O2 saturation dropping and sustaining 87% on room air. Pt placed on 2LNC. MD Quale notified.

## 2022-06-05 NOTE — ED Notes (Signed)
Pt discharge to home. Pt VSS, GCS 15, NAD. Pt verbalized understanding of discharge instructions with no additional questions at this time.

## 2022-06-05 NOTE — ED Notes (Signed)
Pt ambulatory o2 saturation 88-90% on room air, MD Quale notified.

## 2022-06-05 NOTE — Discharge Instructions (Addendum)
I am suspicious your chest tightness may be from a condition called "COPD" which is along the lines of emphysema.  Return to the ER right away if you have severe chest pain, difficulty breathing, fevers, worsening symptoms, or other concerns arise.

## 2022-06-05 NOTE — ED Notes (Signed)
Patient transported to CT 

## 2022-06-05 NOTE — ED Triage Notes (Signed)
Pt here with cp that started this morning. Pt states pain is left sided and does not radiate. Pt also states he has a severe headache. Pt states a tightness in his chest as well.

## 2022-06-06 ENCOUNTER — Telehealth: Payer: Self-pay | Admitting: Emergency Medicine

## 2022-06-06 MED ORDER — PREDNISONE 20 MG PO TABS: 40.0000 mg | ORAL_TABLET | Freq: Every day | ORAL | 0 refills | Status: AC

## 2022-06-06 MED ORDER — AMLODIPINE BESYLATE 2.5 MG PO TABS: 2.5000 mg | ORAL_TABLET | Freq: Every day | ORAL | 2 refills | Status: AC

## 2022-06-06 MED ORDER — AZITHROMYCIN 250 MG PO TABS: ORAL_TABLET | ORAL | 0 refills | Status: AC

## 2022-06-06 MED ORDER — ALBUTEROL SULFATE HFA 108 (90 BASE) MCG/ACT IN AERS: INHALATION_SPRAY | RESPIRATORY_TRACT | 1 refills | Status: AC

## 2022-06-06 NOTE — Telephone Encounter (Signed)
Pt request to send rx to [facility] [address], not [facility]

## 2023-12-17 ENCOUNTER — Emergency Department (HOSPITAL_COMMUNITY)
Admission: EM | Admit: 2023-12-17 | Discharge: 2023-12-18 | Discharge status: Against Medical Advice | Payer: Self-pay | Attending: Emergency Medicine | Admitting: Emergency Medicine

## 2023-12-17 ENCOUNTER — Other Ambulatory Visit: Payer: Self-pay

## 2023-12-17 ENCOUNTER — Encounter (HOSPITAL_COMMUNITY): Payer: Self-pay

## 2023-12-17 DIAGNOSIS — M79604 Pain in right leg: Secondary | ICD-10-CM | POA: Insufficient documentation

## 2023-12-17 DIAGNOSIS — W19XXXA Unspecified fall, initial encounter: Secondary | ICD-10-CM | POA: Insufficient documentation

## 2023-12-17 DIAGNOSIS — Z5321 Procedure and treatment not carried out due to patient leaving prior to being seen by health care provider: Secondary | ICD-10-CM | POA: Insufficient documentation

## 2023-12-17 LAB — COMPREHENSIVE METABOLIC PANEL WITH GFR
ALT: 24 U/L (ref 0–44)
AST: 25 U/L (ref 15–41)
Albumin: 3.6 g/dL (ref 3.5–5.0)
Alkaline Phosphatase: 88 U/L (ref 38–126)
Anion gap: 10 (ref 5–15)
BUN: 5 mg/dL — ABNORMAL LOW (ref 6–20)
CO2: 23 mmol/L (ref 22–32)
Calcium: 9.3 mg/dL (ref 8.9–10.3)
Chloride: 95 mmol/L — ABNORMAL LOW (ref 98–111)
Creatinine, Ser: 0.68 mg/dL (ref 0.61–1.24)
GFR, Estimated: >60 mL/min (ref >60)
Glucose, Bld: 86 mg/dL (ref 70–99)
Potassium: 3.7 mmol/L (ref 3.5–5.1)
Sodium: 128 mmol/L — ABNORMAL LOW (ref 135–145)
Total Bilirubin: 0.4 mg/dL (ref 0.0–1.2)
Total Protein: 7 g/dL (ref 6.5–8.1)

## 2023-12-17 LAB — I-STAT CHEM 8, ED
BUN: 4 mg/dL — ABNORMAL LOW (ref 6–20)
Calcium, Ion: 1.19 mmol/L (ref 1.15–1.40)
Chloride: 93 mmol/L — ABNORMAL LOW (ref 98–111)
Creatinine, Ser: 0.7 mg/dL (ref 0.61–1.24)
Glucose, Bld: 84 mg/dL (ref 70–99)
HCT: 38 % — ABNORMAL LOW (ref 39.0–52.0)
Hemoglobin: 12.9 g/dL — ABNORMAL LOW (ref 13.0–17.0)
Potassium: 3.7 mmol/L (ref 3.5–5.1)
Sodium: 131 mmol/L — ABNORMAL LOW (ref 135–145)
TCO2: 23 mmol/L (ref 22–32)

## 2023-12-17 LAB — CBC WITH DIFFERENTIAL/PLATELET
Abs Immature Granulocytes: 0.01 K/uL (ref 0.00–0.07)
Basophils Absolute: 0.1 K/uL (ref 0.0–0.1)
Basophils Relative: 1 %
Eosinophils Absolute: 0.2 K/uL (ref 0.0–0.5)
Eosinophils Relative: 2 %
HCT: 36.3 % — ABNORMAL LOW (ref 39.0–52.0)
Hemoglobin: 12.6 g/dL — ABNORMAL LOW (ref 13.0–17.0)
Immature Granulocytes: 0 %
Lymphocytes Relative: 24 %
Lymphs Abs: 1.8 K/uL (ref 0.7–4.0)
MCH: 33.6 pg (ref 26.0–34.0)
MCHC: 34.7 g/dL (ref 30.0–36.0)
MCV: 96.8 fL (ref 80.0–100.0)
Monocytes Absolute: 0.8 K/uL (ref 0.1–1.0)
Monocytes Relative: 11 %
Neutro Abs: 4.6 K/uL (ref 1.7–7.7)
Neutrophils Relative %: 62 %
Platelets: 257 K/uL (ref 150–400)
RBC: 3.75 MIL/uL — ABNORMAL LOW (ref 4.22–5.81)
RDW: 12.6 % (ref 11.5–15.5)
WBC: 7.5 K/uL (ref 4.0–10.5)
nRBC: 0 % (ref 0.0–0.2)

## 2023-12-17 LAB — CK: Total CK: 43 U/L — ABNORMAL LOW (ref 49–397)

## 2023-12-17 LAB — MAGNESIUM: Magnesium: 1.8 mg/dL (ref 1.7–2.4)

## 2023-12-17 NOTE — ED Provider Triage Note (Signed)
 Emergency Medicine Provider Triage Evaluation Note  Daniel Andrade , a 58 y.o. male  was evaluated in triage.  Pt complains of worsening lower leg pain on exertion.  Right greater than left.  This is been present for months.  He has already followed up with vascular surgery for this however patient reports that they refused to do anything until he stopped smoking and drinking.  He reports the pain has not worsened in the past few weeks.  Denies any chest pain or shortness of breath.  Hurting worse on the right than the left.  Review of Systems  Positive:  Negative:   Physical Exam  BP (!) 149/83 (BP Location: Right Arm)   Pulse 84   Temp 98.6 F (37 C) (Oral)   Resp (!) 24   Ht 5' 8 (1.727 m)   Wt 79.4 kg   SpO2 100%   BMI 26.61 kg/m  Gen:   Awake, no distress   Resp:  Normal effort  MSK:   Moves extremities without difficulty  Other:  Dopplerable pulses R>L. No swelling noted. Compartments soft.   Medical Decision Making  Medically screening exam initiated at 9:31 PM.  Appropriate orders placed.  Ozell LITTIE Leriche was informed that the remainder of the evaluation will be completed by another provider, this initial triage assessment does not replace that evaluation, and the importance of remaining in the ED until their evaluation is complete.  Pre-chart evaluation, examination and patient symptoms seeing incongruence with the patient's previous notes. Will order labs. Discussed this case with my attending.    Bernis Ernst, PA-C 12/17/23 2134

## 2023-12-17 NOTE — ED Triage Notes (Signed)
 C/O right leg pain that started several months ago. Pt states he has fell several times because of it. Axox4. Pt ambulatory in ED.

## 2023-12-18 NOTE — ED Notes (Signed)
 Pt left after triage, state he can't wait any longer to be seen
